# Patient Record
Sex: Female | Born: 1955 | Race: White | Hispanic: No
Health system: Southern US, Community
[De-identification: ages and names within clinical notes are randomized; demographics above are authoritative.]

## PROBLEM LIST (undated history)

## (undated) DIAGNOSIS — R569 Unspecified convulsions: Secondary | ICD-10-CM

## (undated) DIAGNOSIS — K769 Liver disease, unspecified: Secondary | ICD-10-CM

## (undated) DIAGNOSIS — E119 Type 2 diabetes mellitus without complications: Secondary | ICD-10-CM

## (undated) DIAGNOSIS — I10 Essential (primary) hypertension: Secondary | ICD-10-CM

## (undated) DIAGNOSIS — C801 Malignant (primary) neoplasm, unspecified: Secondary | ICD-10-CM

## (undated) HISTORY — PX: MASTECTOMY: SHX3

## (undated) HISTORY — PX: UPPER GASTROINTESTINAL ENDOSCOPY: SHX188

---

## 2011-10-16 DIAGNOSIS — K7581 Nonalcoholic steatohepatitis (NASH): Secondary | ICD-10-CM | POA: Insufficient documentation

## 2012-06-04 DIAGNOSIS — G40209 Localization-related (focal) (partial) symptomatic epilepsy and epileptic syndromes with complex partial seizures, not intractable, without status epilepticus: Secondary | ICD-10-CM | POA: Insufficient documentation

## 2015-06-07 ENCOUNTER — Encounter (HOSPITAL_COMMUNITY): Payer: Self-pay | Admitting: Emergency Medicine

## 2015-06-07 ENCOUNTER — Emergency Department (HOSPITAL_COMMUNITY)
Admission: EM | Admit: 2015-06-07 | Discharge: 2015-06-07 | Disposition: A | Discharge status: Home / Self Care | Payer: Medicare Other | Attending: Physician Assistant | Admitting: Physician Assistant

## 2015-06-07 ENCOUNTER — Emergency Department (HOSPITAL_COMMUNITY): Payer: Medicare Other

## 2015-06-07 DIAGNOSIS — Z87891 Personal history of nicotine dependence: Secondary | ICD-10-CM | POA: Diagnosis not present

## 2015-06-07 DIAGNOSIS — Z85118 Personal history of other malignant neoplasm of bronchus and lung: Secondary | ICD-10-CM | POA: Insufficient documentation

## 2015-06-07 DIAGNOSIS — R110 Nausea: Secondary | ICD-10-CM | POA: Insufficient documentation

## 2015-06-07 DIAGNOSIS — G40909 Epilepsy, unspecified, not intractable, without status epilepticus: Secondary | ICD-10-CM | POA: Diagnosis not present

## 2015-06-07 DIAGNOSIS — N200 Calculus of kidney: Secondary | ICD-10-CM | POA: Diagnosis not present

## 2015-06-07 DIAGNOSIS — Z8719 Personal history of other diseases of the digestive system: Secondary | ICD-10-CM | POA: Insufficient documentation

## 2015-06-07 DIAGNOSIS — R109 Unspecified abdominal pain: Secondary | ICD-10-CM

## 2015-06-07 DIAGNOSIS — Z794 Long term (current) use of insulin: Secondary | ICD-10-CM | POA: Insufficient documentation

## 2015-06-07 DIAGNOSIS — E119 Type 2 diabetes mellitus without complications: Secondary | ICD-10-CM | POA: Diagnosis not present

## 2015-06-07 DIAGNOSIS — Z79899 Other long term (current) drug therapy: Secondary | ICD-10-CM | POA: Insufficient documentation

## 2015-06-07 HISTORY — DX: Malignant (primary) neoplasm, unspecified: C80.1

## 2015-06-07 HISTORY — DX: Unspecified convulsions: R56.9

## 2015-06-07 HISTORY — DX: Liver disease, unspecified: K76.9

## 2015-06-07 HISTORY — DX: Type 2 diabetes mellitus without complications: E11.9

## 2015-06-07 LAB — CBC WITH DIFFERENTIAL/PLATELET
BASOS PCT: 0 % (ref 0–1)
Basophils Absolute: 0 10*3/uL (ref 0.0–0.1)
EOS PCT: 1 % (ref 0–5)
Eosinophils Absolute: 0.1 10*3/uL (ref 0.0–0.7)
HEMATOCRIT: 43.4 % (ref 36.0–46.0)
Hemoglobin: 14.6 g/dL (ref 12.0–15.0)
LYMPHS ABS: 2.1 10*3/uL (ref 0.7–4.0)
Lymphocytes Relative: 27 % (ref 12–46)
MCH: 27.2 pg (ref 26.0–34.0)
MCHC: 33.6 g/dL (ref 30.0–36.0)
MCV: 80.8 fL (ref 78.0–100.0)
Monocytes Absolute: 0.6 10*3/uL (ref 0.1–1.0)
Monocytes Relative: 8 % (ref 3–12)
Neutro Abs: 4.9 10*3/uL (ref 1.7–7.7)
Neutrophils Relative %: 64 % (ref 43–77)
PLATELETS: 177 10*3/uL (ref 150–400)
RBC: 5.37 MIL/uL — ABNORMAL HIGH (ref 3.87–5.11)
RDW: 13.4 % (ref 11.5–15.5)
WBC: 7.7 10*3/uL (ref 4.0–10.5)

## 2015-06-07 LAB — CBG MONITORING, ED: GLUCOSE-CAPILLARY: 201 mg/dL — AB (ref 65–99)

## 2015-06-07 LAB — BASIC METABOLIC PANEL
ANION GAP: 11 (ref 5–15)
BUN: 12 mg/dL (ref 6–20)
CO2: 22 mmol/L (ref 22–32)
CREATININE: 0.88 mg/dL (ref 0.44–1.00)
Calcium: 9.8 mg/dL (ref 8.9–10.3)
Chloride: 103 mmol/L (ref 101–111)
GFR calc Af Amer: >60 mL/min (ref >60)
GFR calc non Af Amer: >60 mL/min (ref >60)
Glucose, Bld: 217 mg/dL — ABNORMAL HIGH (ref 65–99)
Potassium: 4 mmol/L (ref 3.5–5.1)
SODIUM: 136 mmol/L (ref 135–145)

## 2015-06-07 LAB — URINALYSIS, ROUTINE W REFLEX MICROSCOPIC
Bilirubin Urine: NEGATIVE
Glucose, UA: 100 mg/dL — AB
KETONES UR: 15 mg/dL — AB
NITRITE: NEGATIVE
Protein, ur: 30 mg/dL — AB
Specific Gravity, Urine: 1.017 (ref 1.005–1.030)
UROBILINOGEN UA: 1 mg/dL (ref 0.0–1.0)
pH: 8.5 — ABNORMAL HIGH (ref 5.0–8.0)

## 2015-06-07 LAB — URINE MICROSCOPIC-ADD ON

## 2015-06-07 MED ORDER — MORPHINE SULFATE 4 MG/ML IJ SOLN
4.0000 mg | Freq: Once | INTRAMUSCULAR | Status: AC
  Administered 2015-06-07: 4 mg via INTRAVENOUS
  Filled 2015-06-07: qty 1

## 2015-06-07 MED ORDER — SODIUM CHLORIDE 0.9 % IV BOLUS (SEPSIS)
1000.0000 mL | Freq: Once | INTRAVENOUS | Status: AC
  Administered 2015-06-07: 1000 mL via INTRAVENOUS

## 2015-06-07 MED ORDER — OXYCODONE-ACETAMINOPHEN 5-325 MG PO TABS: 1.0000 | ORAL_TABLET | Freq: Four times a day (QID) | ORAL | Status: AC | PRN

## 2015-06-07 MED ORDER — TAMSULOSIN HCL 0.4 MG PO CAPS: 0.4000 mg | ORAL_CAPSULE | Freq: Every day | ORAL | Status: AC

## 2015-06-07 MED ORDER — OXYCODONE-ACETAMINOPHEN 5-325 MG PO TABS
1.0000 | ORAL_TABLET | Freq: Once | ORAL | Status: AC
  Administered 2015-06-07: 1 via ORAL
  Filled 2015-06-07: qty 1

## 2015-06-07 MED ORDER — KETOROLAC TROMETHAMINE 30 MG/ML IJ SOLN
30.0000 mg | Freq: Once | INTRAMUSCULAR | Status: AC
  Administered 2015-06-07: 30 mg via INTRAVENOUS
  Filled 2015-06-07: qty 1

## 2015-06-07 NOTE — ED Notes (Signed)
148/88 BP 78P 100% O2 RA  CBG 237.  Pt C/o R flank pain that began today at 1230. It radiates around to her umbilicus.  Pt was treated with oral antibiotics to help her pass a kidney stone back on 05/19/15 but she developed a rash and her PCP DC'd antibiotics.  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  No meds taken today. Pt has not voided since last night around 930am.

## 2015-06-07 NOTE — ED Notes (Signed)
Pt states that she is in to much pain to go to bathroom and wants a few more minutes for morphine to work.

## 2015-06-07 NOTE — ED Provider Notes (Signed)
CSN: [id number]     Arrival date & time 06/07/15  1414 History   First MD Initiated Contact with Patient 06/07/15 1450     Chief Complaint  Patient presents with  . Flank Pain     (Consider location/radiation/quality/duration/timing/severity/associated sxs/prior Treatment) HPI Comments: Patient is a 59 year old female presenting with right flank pain radiating towards her right groin. Patient diagnosed with kidney stone on July 6 of this month in [place]. Patient given ketorolac with alleviation of symptoms. Patient also treated partially for urinary tract infection. She took 4 days of abx.. Patient has had no symptoms for the last 2-1/2 weeks.Started a this morning at noon having some right flank pain. No fevers. Mild nausea.  Patient is a 59 y.o. female presenting with flank pain.  Flank Pain Pertinent negatives include no chest pain.    Past Medical History  Diagnosis Date  . Cancer   . Diabetes mellitus without complication   . Seizures   . Liver disease    Past Surgical History  Procedure Laterality Date  . Mastectomy     No family history on file. History  Substance Use Topics  . Smoking status: Not on file  . Smokeless tobacco: Never Used  . Alcohol Use: No   OB History    No data available     Review of Systems  Constitutional: Negative for activity change and fatigue.  HENT: Negative for congestion and drooling.   Eyes: Negative for discharge.  Respiratory: Negative for cough and chest tightness.   Cardiovascular: Negative for chest pain.  Gastrointestinal: Positive for nausea. Negative for abdominal distention.  Genitourinary: Positive for flank pain. Negative for dysuria and difficulty urinating.  Musculoskeletal: Negative for joint swelling.  Skin: Negative for rash.  Allergic/Immunologic: Negative for immunocompromised state.  Neurological: Negative for seizures and speech difficulty.  Psychiatric/Behavioral: Negative for behavioral problems and  agitation.      Allergies  Review of patient's allergies indicates no known allergies.  Home Medications   Prior to Admission medications   Medication Sig Start Date End Date Taking? Authorizing Provider  anastrozole (ARIMIDEX) 1 MG tablet Take 1 mg by mouth daily.   Yes Historical Provider, MD  aspirin 81 MG tablet Take 81 mg by mouth daily.   Yes Historical Provider, MD  atorvastatin (LIPITOR) 20 MG tablet Take 20 mg by mouth daily.   Yes Historical Provider, MD  Cholecalciferol (VITAMIN D PO) Take 1 tablet by mouth daily.   Yes Historical Provider, MD  cloNIDine (CATAPRES) 0.1 MG tablet Take 0.1 mg by mouth 2 (two) times daily.   Yes Historical Provider, MD  escitalopram (LEXAPRO) 20 MG tablet Take 20 mg by mouth daily.   Yes Historical Provider, MD  glimepiride (AMARYL) 4 MG tablet Take 4 mg by mouth 2 (two) times daily.   Yes Historical Provider, MD  insulin glargine (LANTUS) 100 UNIT/ML injection Inject 70 Units into the skin at bedtime.   Yes Historical Provider, MD  lamoTRIgine (LAMICTAL) 200 MG tablet Take 200-300 mg by mouth daily. Pt takes 200 mg in the morning and 300 mg in the evening   Yes Historical Provider, MD  lisinopril (PRINIVIL,ZESTRIL) 10 MG tablet Take 10 mg by mouth daily.   Yes Historical Provider, MD  metFORMIN (GLUCOPHAGE) 1000 MG tablet Take 1,000 mg by mouth 2 (two) times daily with a meal.   Yes Historical Provider, MD  Omega-3 Fatty Acids (FISH OIL) 1000 MG CAPS Take 1,000 mg by mouth daily.   Yes  Historical Provider, MD  ranitidine (ZANTAC) 300 MG tablet Take 300 mg by mouth at bedtime.   Yes Historical Provider, MD  Vit B6-Vit B12-Omega 3 Acids (VITAMIN B PLUS+ PO) Take 1 tablet by mouth daily.   Yes Historical Provider, MD  VITAMIN E PO Take 2 tablets by mouth daily.   Yes Historical Provider, MD   BP 174/60 mmHg  Pulse 72  Temp(Src) 98.5 F (36.9 C) (Oral)  Resp 24  Ht 5\' 5"  (1.651 m)  Wt 228 lb (103.42 kg)  BMI 37.94 kg/m2  SpO2 100% Physical  Exam  Constitutional: She is oriented to person, place, and time. She appears well-developed and well-nourished.  HENT:  Head: Normocephalic and atraumatic.  Eyes: Conjunctivae are normal. Right eye exhibits no discharge.  Neck: Neck supple.  Cardiovascular: Normal rate, regular rhythm and normal heart sounds.   No murmur heard. Pulmonary/Chest: Effort normal and breath sounds normal. She has no wheezes. She has no rales.  Abdominal: Soft. She exhibits no distension. There is no tenderness.  Right CVA tenderness.  Musculoskeletal: Normal range of motion. She exhibits no edema.  Neurological: She is oriented to person, place, and time. No cranial nerve deficit.  Skin: Skin is warm and dry. No rash noted. She is not diaphoretic.  Psychiatric: She has a normal mood and affect. Her behavior is normal.  Nursing note and vitals reviewed.   ED Course  Procedures (including critical care time) Labs Review Labs Reviewed  URINALYSIS, ROUTINE W REFLEX MICROSCOPIC (NOT AT [facility])  CBC WITH DIFFERENTIAL/PLATELET  BASIC METABOLIC PANEL    Imaging Review No results found.   EKG Interpretation None      MDM   Final diagnoses:  None    Patient's 58 year old female presenting with right flank pain. History of recent kidney stone. We had patient centered decision-making about CT versus ultrasound to diagnose today's pain. Patient feels strongly about getting a CT. No fevers but will look for urinary tract infection today.  Pt will call urologist for follow up this week.   Sharleen Szczesny Julio Alm, MD 06/07/15 2248

## 2015-06-07 NOTE — ED Notes (Signed)
Patient transported to X-ray 

## 2015-06-07 NOTE — ED Notes (Signed)
Order for Percocet was not a Protocol order, it was a V.O. with read back

## 2015-06-07 NOTE — ED Notes (Signed)
Prior note entered on wrong chart

## 2015-06-07 NOTE — ED Notes (Signed)
Pt states that she will give a urine sample in the next 10 minutes

## 2015-06-07 NOTE — Discharge Instructions (Signed)
You were found to have kidney stone. Please take pain medications as needed. I anticipate that this will get better in 3-4 days. You can make an appointment with follow-up with urology.  You will need to see urology if it does not get better.

## 2015-06-07 NOTE — ED Notes (Signed)
Bed: KJ03 Expected date:  Expected time:  Means of arrival:  Comments: EMS 61 F kidney stones

## 2015-08-19 DIAGNOSIS — G4730 Sleep apnea, unspecified: Secondary | ICD-10-CM | POA: Insufficient documentation

## 2015-08-23 DIAGNOSIS — N200 Calculus of kidney: Secondary | ICD-10-CM | POA: Insufficient documentation

## 2015-09-13 DIAGNOSIS — M818 Other osteoporosis without current pathological fracture: Secondary | ICD-10-CM | POA: Insufficient documentation

## 2016-02-07 DIAGNOSIS — M1990 Unspecified osteoarthritis, unspecified site: Secondary | ICD-10-CM | POA: Insufficient documentation

## 2016-05-21 IMAGING — CT CT ABD-PELV W/O CM
2 of 3 series · 17 of 31 positions shown, 19 images · non-contrast
Comparison: None.

CLINICAL DATA: Right flank pain

EXAM:
CT ABDOMEN AND PELVIS WITHOUT CONTRAST
TECHNIQUE: Multidetector CT imaging of the abdomen and pelvis was performed
following the standard protocol without IV contrast.

[Series 3: coronal · coronal · 0.92mm/px · 3 of 112 slices shown]
[im 38/112  soft-tissue]
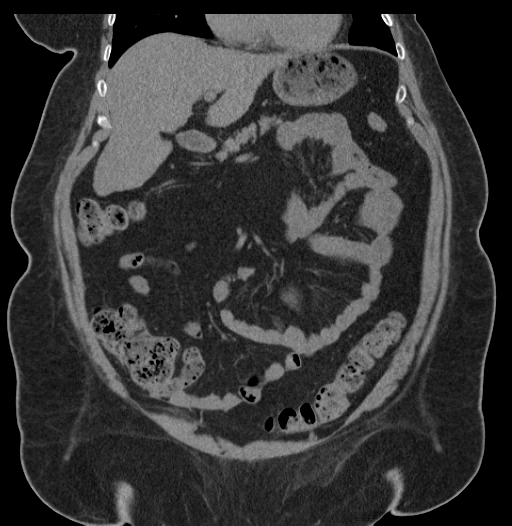
[im 50/112  soft-tissue]
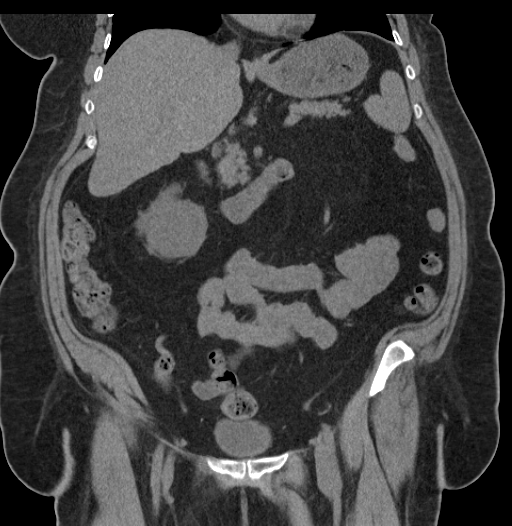
[im 62/112  soft-tissue]
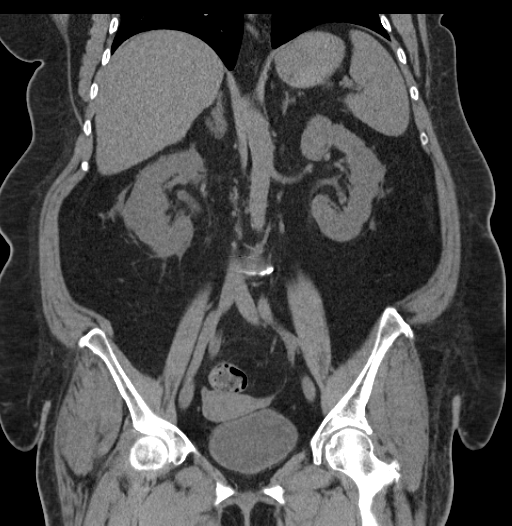

[Series 6: lung · axial · 0.90mm/px · z∈[-39,+31]mm · 14 of 17 slices shown, 16 images]
[im 2/17  soft-tissue]
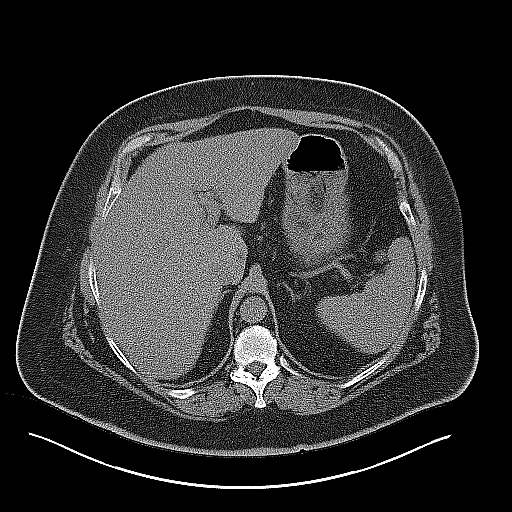
[im 2/17  bone]
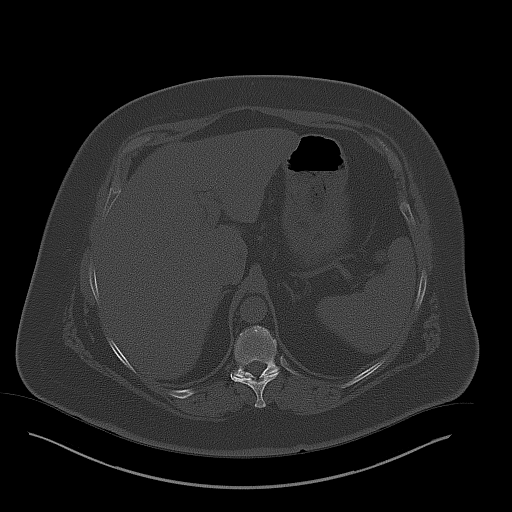
[im 3/17  soft-tissue]
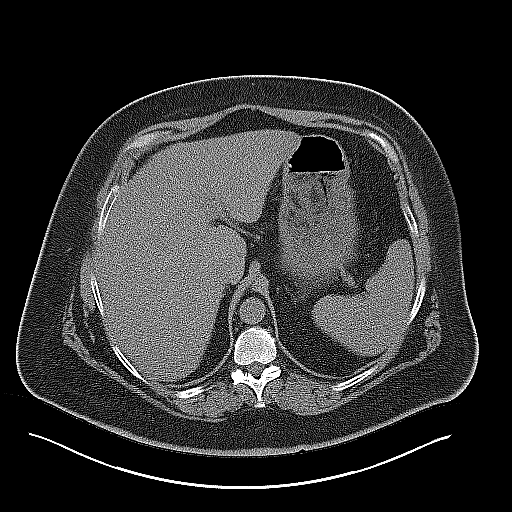
[im 4/17  soft-tissue]
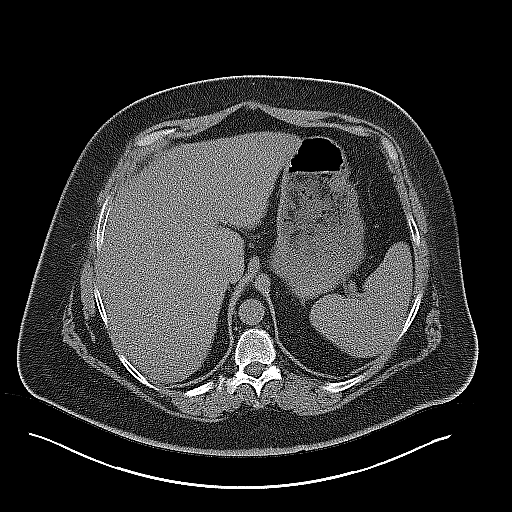
[im 5/17  soft-tissue]
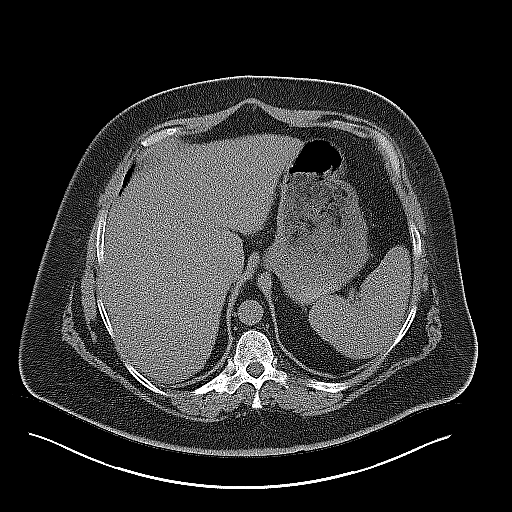
[im 6/17  soft-tissue]
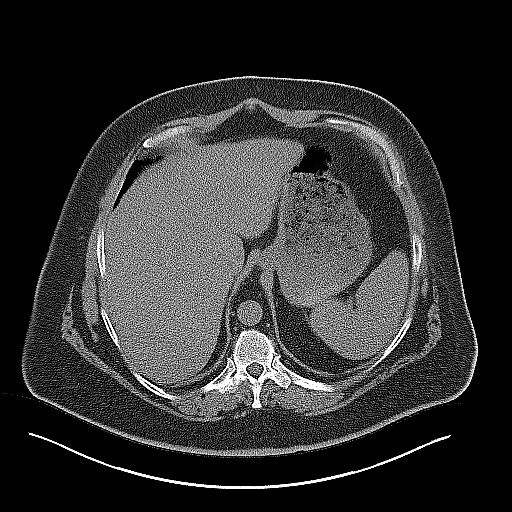
[im 7/17  soft-tissue]
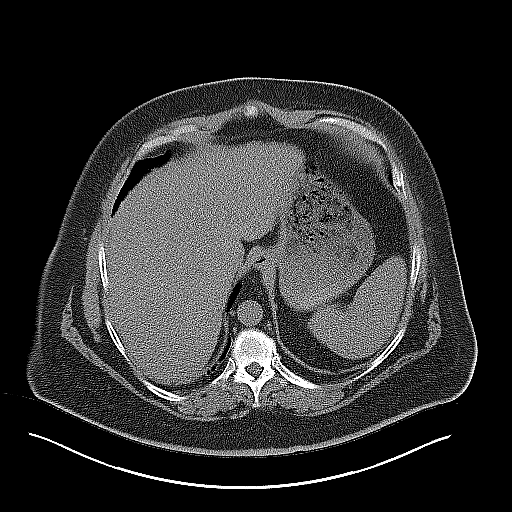
[im 8/17  soft-tissue]
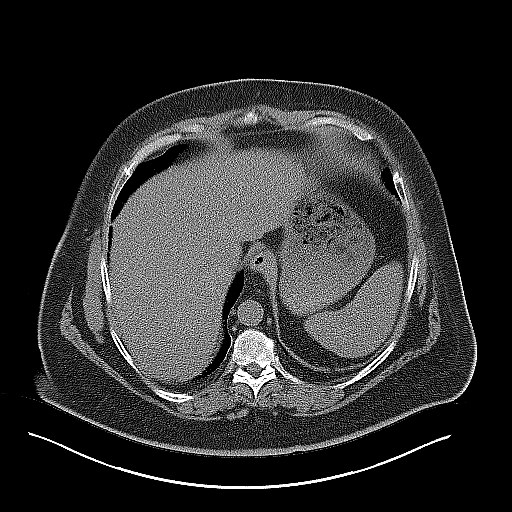
[im 10/17  soft-tissue]
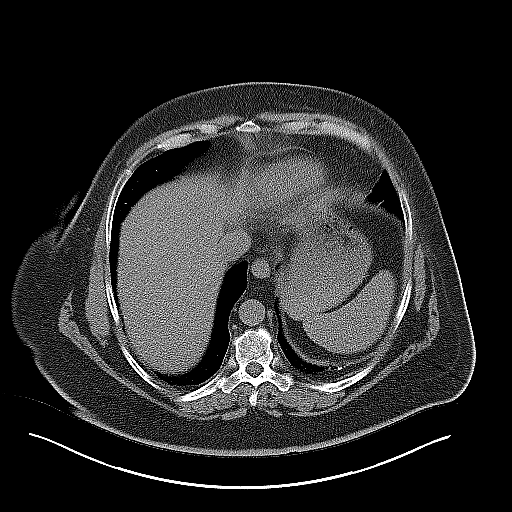
[im 11/17  soft-tissue]
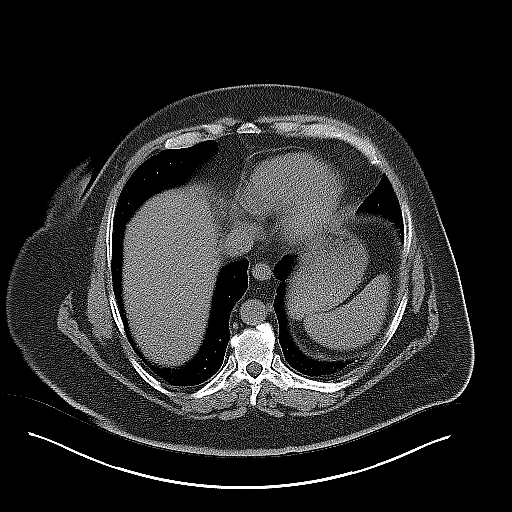
[im 11/17  bone]
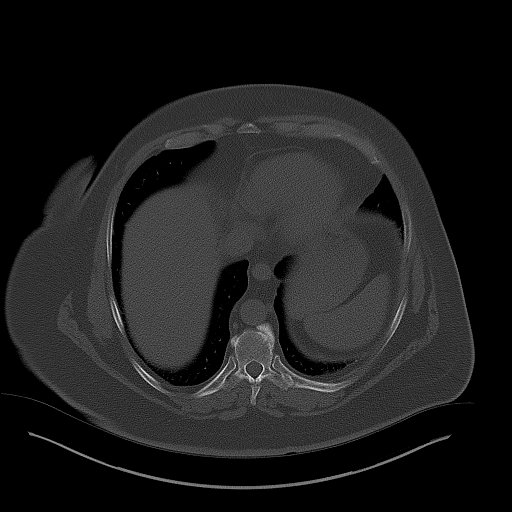
[im 12/17  soft-tissue]
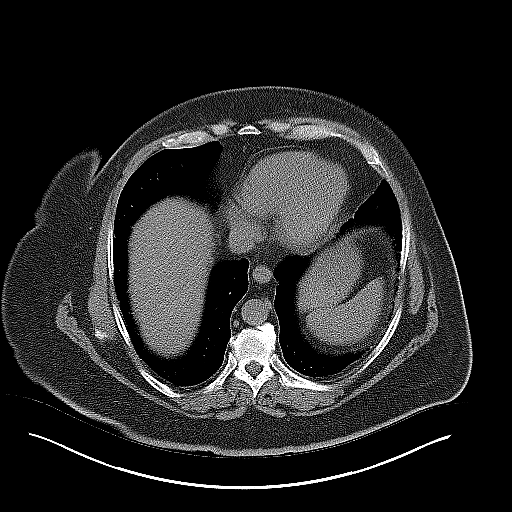
[im 13/17  soft-tissue]
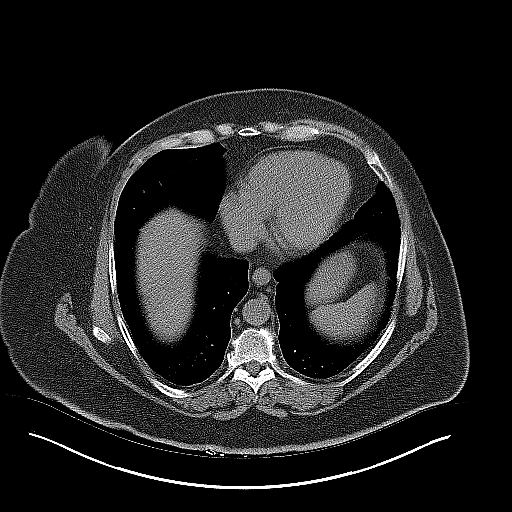
[im 14/17  soft-tissue]
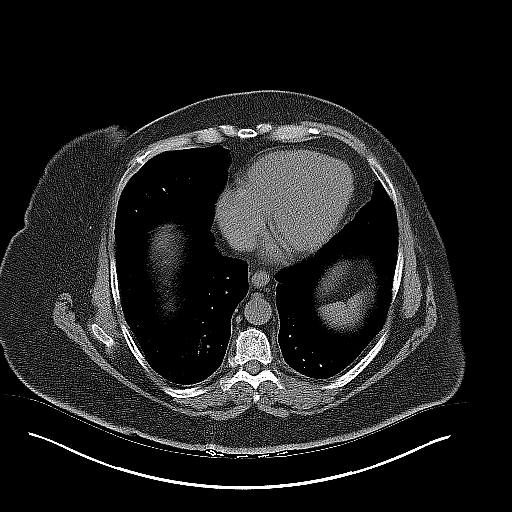
[im 15/17  soft-tissue]
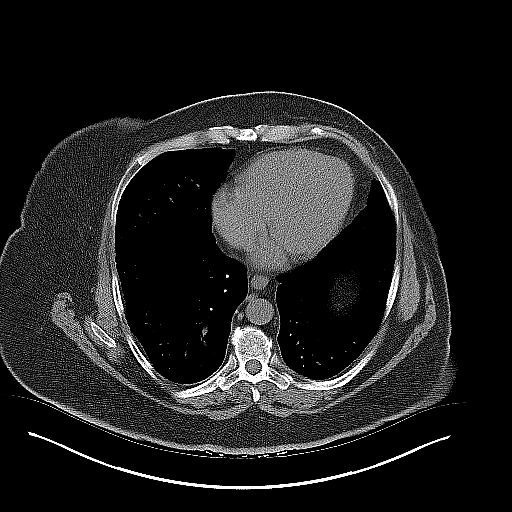
[im 16/17  soft-tissue]
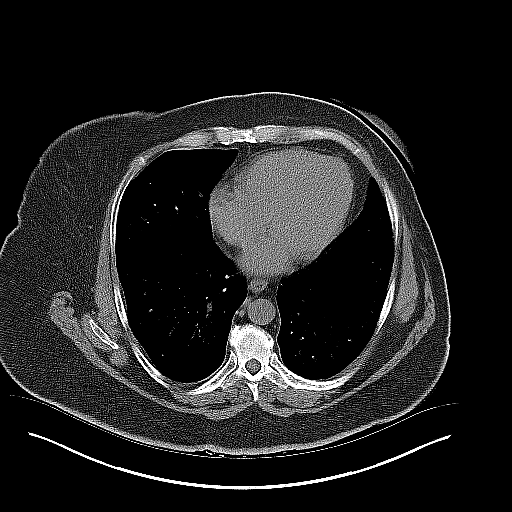

[17 of 31 positions shown; findings below may reference images not displayed]

FINDINGS: Lower chest:  Lung bases clear.

Hepatobiliary: Mild nodularity liver suggesting hepatocellular
disease. This would require further evaluation for confirmation.
Gallbladder surgically absent. No biliary ductal dilatation.

Pancreas: Negative

Spleen: Negative

Adrenals/Urinary Tract: Moderate right hydronephrosis. Perinephric
edema on the right. Right ureter is dilated down to the bladder
where there is a 4 x 6 mm stone at the right UVJ causing
obstruction. Left kidney normal. No other renal calculi or mass.
Adrenal glands normal.

Stomach/Bowel: Negative for bowel obstruction. No bowel edema.
Appendix normal.

Vascular/Lymphatic: Negative

Reproductive: Normal uterus and adnexa.

Other: Negative for ascites.  No mass or adenopathy.

Musculoskeletal: Disc degeneration and spurring L5-S1.
IMPRESSION: 4 x 6 mm stone right UVJ causing obstruction of the right kidney. No
other renal calculi

Mild nodular contour of the liver suggesting hepatocellular disease
such as cirrhosis. Further evaluation with CT or MRI of the liver
with contrast would be necessary to confirm the finding.

## 2019-05-12 DIAGNOSIS — Z853 Personal history of malignant neoplasm of breast: Secondary | ICD-10-CM | POA: Insufficient documentation

## 2020-07-12 DIAGNOSIS — D320 Benign neoplasm of cerebral meninges: Secondary | ICD-10-CM | POA: Insufficient documentation

## 2020-12-15 DIAGNOSIS — K7460 Unspecified cirrhosis of liver: Secondary | ICD-10-CM | POA: Insufficient documentation

## 2020-12-15 DIAGNOSIS — I83891 Varicose veins of right lower extremities with other complications: Secondary | ICD-10-CM | POA: Insufficient documentation

## 2021-01-03 LAB — EXTERNAL GENERIC LAB PROCEDURE: COLOGUARD: NEGATIVE

## 2021-01-03 LAB — COLOGUARD: COLOGUARD: NEGATIVE

## 2021-05-11 ENCOUNTER — Other Ambulatory Visit: Payer: Self-pay

## 2021-05-11 ENCOUNTER — Emergency Department (HOSPITAL_COMMUNITY)
Admission: EM | Admit: 2021-05-11 | Discharge: 2021-05-11 | Disposition: A | Discharge status: Home / Self Care | Payer: Medicare Other | Attending: Emergency Medicine | Admitting: Emergency Medicine

## 2021-05-11 ENCOUNTER — Emergency Department (HOSPITAL_COMMUNITY): Payer: Medicare Other

## 2021-05-11 ENCOUNTER — Encounter (HOSPITAL_COMMUNITY): Payer: Self-pay

## 2021-05-11 DIAGNOSIS — R072 Precordial pain: Secondary | ICD-10-CM | POA: Insufficient documentation

## 2021-05-11 DIAGNOSIS — Z7982 Long term (current) use of aspirin: Secondary | ICD-10-CM | POA: Insufficient documentation

## 2021-05-11 DIAGNOSIS — E119 Type 2 diabetes mellitus without complications: Secondary | ICD-10-CM | POA: Diagnosis not present

## 2021-05-11 DIAGNOSIS — Z7984 Long term (current) use of oral hypoglycemic drugs: Secondary | ICD-10-CM | POA: Insufficient documentation

## 2021-05-11 DIAGNOSIS — Z79899 Other long term (current) drug therapy: Secondary | ICD-10-CM | POA: Diagnosis not present

## 2021-05-11 DIAGNOSIS — I10 Essential (primary) hypertension: Secondary | ICD-10-CM | POA: Diagnosis not present

## 2021-05-11 DIAGNOSIS — Z859 Personal history of malignant neoplasm, unspecified: Secondary | ICD-10-CM | POA: Diagnosis not present

## 2021-05-11 HISTORY — DX: Essential (primary) hypertension: I10

## 2021-05-11 LAB — COMPREHENSIVE METABOLIC PANEL
ALT: 28 U/L (ref 0–44)
AST: 30 U/L (ref 15–41)
Albumin: 3.4 g/dL — ABNORMAL LOW (ref 3.5–5.0)
Alkaline Phosphatase: 98 U/L (ref 38–126)
Anion gap: 8 (ref 5–15)
BUN: 8 mg/dL (ref 8–23)
CO2: 25 mmol/L (ref 22–32)
Calcium: 9.8 mg/dL (ref 8.9–10.3)
Chloride: 103 mmol/L (ref 98–111)
Creatinine, Ser: 0.64 mg/dL (ref 0.44–1.00)
GFR, Estimated: >60 mL/min (ref >60)
Glucose, Bld: 89 mg/dL (ref 70–99)
Potassium: 3.8 mmol/L (ref 3.5–5.1)
Sodium: 136 mmol/L (ref 135–145)
Total Bilirubin: 0.7 mg/dL (ref 0.3–1.2)
Total Protein: 7.2 g/dL (ref 6.5–8.1)

## 2021-05-11 LAB — CBC WITH DIFFERENTIAL/PLATELET
Abs Immature Granulocytes: 0.02 10*3/uL (ref 0.00–0.07)
Basophils Absolute: 0 10*3/uL (ref 0.0–0.1)
Basophils Relative: 0 %
Eosinophils Absolute: 0 10*3/uL (ref 0.0–0.5)
Eosinophils Relative: 1 %
HCT: 37.7 % (ref 36.0–46.0)
Hemoglobin: 12.4 g/dL (ref 12.0–15.0)
Immature Granulocytes: 0 %
Lymphocytes Relative: 31 %
Lymphs Abs: 1.8 10*3/uL (ref 0.7–4.0)
MCH: 26.9 pg (ref 26.0–34.0)
MCHC: 32.9 g/dL (ref 30.0–36.0)
MCV: 81.8 fL (ref 80.0–100.0)
Monocytes Absolute: 0.5 10*3/uL (ref 0.1–1.0)
Monocytes Relative: 8 %
Neutro Abs: 3.5 10*3/uL (ref 1.7–7.7)
Neutrophils Relative %: 60 %
Platelets: 119 10*3/uL — ABNORMAL LOW (ref 150–400)
RBC: 4.61 MIL/uL (ref 3.87–5.11)
RDW: 15.4 % (ref 11.5–15.5)
WBC: 5.9 10*3/uL (ref 4.0–10.5)
nRBC: 0 % (ref 0.0–0.2)

## 2021-05-11 LAB — TROPONIN I (HIGH SENSITIVITY): Troponin I (High Sensitivity): 4 ng/L (ref <18)

## 2021-05-11 LAB — LIPASE, BLOOD: Lipase: 32 U/L (ref 11–51)

## 2021-05-11 NOTE — ED Triage Notes (Signed)
Pt arrives via [employer]. EMS for CP. Pt reports sudden onset of L sided CP radiating to her back. Pt has hx HTN, HLD, DM. 324 mg ASA given by EMS.   #20 R FA by EMS   EMS last VS - CBG 93, 122/76, HR 80, 97% on RA, 98.4 temp.

## 2021-05-11 NOTE — ED Notes (Signed)
Patient verbalizes understanding of discharge instructions. Opportunity for questioning and answers were provided. Armband removed by staff, pt discharged from ED ambulatory.   

## 2021-05-11 NOTE — Discharge Instructions (Signed)
Please read and follow all provided instructions.  Your diagnoses today include:  1. Precordial pain     Tests performed today include: An EKG of your heart A chest x-ray Cardiac enzymes - a blood test for heart muscle damage.  The first test was normal.  We would typically like to check a second test to ensure that this level is not going up.  Blood counts and electrolytes Vital signs. See below for your results today.   Medications prescribed:  None  Take any prescribed medications only as directed.  Follow-up instructions: Please follow-up with your primary care provider as soon as you can for further evaluation of your symptoms.   Return instructions:  SEEK IMMEDIATE MEDICAL ATTENTION IF: You have severe chest pain, especially if the pain is crushing or pressure-like and spreads to the arms, back, neck, or jaw, or if you have sweating, nausea (feeling sick to your stomach), or shortness of breath. THIS IS AN EMERGENCY. Don't wait to see if the pain will go away. Get medical help at once. Call 911 or 0 (operator). DO NOT drive yourself to the hospital.  Your chest pain gets worse and does not go away with rest.  You have an attack of chest pain lasting longer than usual, despite rest and treatment with the medications your caregiver has prescribed.  You wake from sleep with chest pain or shortness of breath. You feel dizzy or faint. You have chest pain not typical of your usual pain for which you originally saw your caregiver.  You have any other emergent concerns regarding your health.  Additional Information: Chest pain comes from many different causes. Your caregiver has diagnosed you as having chest pain that is not specific for one problem, but does not require admission.  You are at low risk for an acute heart condition or other serious illness.   Your vital signs today were: BP 112/74   Pulse 80   Temp 98.4 F (36.9 C) (Oral)   Resp 11   Ht 5\' 5"  (1.651 m)   Wt  102.1 kg   SpO2 100%   BMI 37.44 kg/m  If your blood pressure (BP) was elevated above 135/85 this visit, please have this repeated by your doctor within one month. --------------

## 2021-05-11 NOTE — ED Notes (Signed)
Pt ambulatory to restroom

## 2021-05-11 NOTE — ED Notes (Signed)
Pt refusing second troponin. MD and PA notified.

## 2021-05-11 NOTE — ED Provider Notes (Signed)
[facility] EMERGENCY DEPARTMENT Provider Note   CSN: [id number] Arrival date & time: 05/11/21  1721     History Chief Complaint  Patient presents with   Chest Pain    Natasha Bond is a 65 y.o. female.  Patient with history of diabetes, cirrhosis of the liver, hypertension, high cholesterol --presents to the emergency department for evaluation of chest pain.  Pain started about 1 hour prior to arrival.  She describes a heavy kind of pain in her mid chest with radiation to her middle back.  No associated vomiting, diaphoresis, shortness of breath.  Pain did not moved to the arm or to the jaw.  No associated abdominal pain.  She does not report any weakness or other stroke type symptoms.  EMS was called due to severity of pain.  They gave aspirin.  Symptoms have improved but not resolved.  She had a liver biopsy performed 2 days ago.  The onset of this condition was acute. The course is constant. Aggravating factors: none. Alleviating factors: none.        Past Medical History:  Diagnosis Date   Cancer ([place])    Diabetes mellitus without complication ([place])    Hypertension    Liver disease    Seizures ([place])     There are no problems to display for this patient.   Past Surgical History:  Procedure Laterality Date   MASTECTOMY     UPPER GASTROINTESTINAL ENDOSCOPY       OB History   No obstetric history on file.     No family history on file.  Social History   Tobacco Use   Smokeless tobacco: Never  Substance Use Topics   Alcohol use: No   Drug use: No    Home Medications Prior to Admission medications   Medication Sig Start Date End Date Taking? Authorizing Provider  anastrozole (ARIMIDEX) 1 MG tablet Take 1 mg by mouth daily.    [provider]  aspirin 81 MG tablet Take 81 mg by mouth daily.    [provider]  atorvastatin (LIPITOR) 20 MG tablet Take 20 mg by mouth daily.    [provider]  Cholecalciferol  (VITAMIN D PO) Take 1 tablet by mouth daily.    [provider]  cloNIDine (CATAPRES) 0.1 MG tablet Take 0.1 mg by mouth 2 (two) times daily.    [provider]  escitalopram (LEXAPRO) 20 MG tablet Take 20 mg by mouth daily.    [provider]  glimepiride (AMARYL) 4 MG tablet Take 4 mg by mouth 2 (two) times daily.    [provider]  insulin glargine (LANTUS) 100 UNIT/ML injection Inject 70 Units into the skin at bedtime.    [provider]  lamoTRIgine (LAMICTAL) 200 MG tablet Take 200-300 mg by mouth daily. Pt takes 200 mg in the morning and 300 mg in the evening    [provider]  lisinopril (PRINIVIL,ZESTRIL) 10 MG tablet Take 10 mg by mouth daily.    [provider]  metFORMIN (GLUCOPHAGE) 1000 MG tablet Take 1,000 mg by mouth 2 (two) times daily with a meal.    [provider]  Omega-3 Fatty Acids (FISH OIL) 1000 MG CAPS Take 1,000 mg by mouth daily.    [provider]  oxyCODONE-acetaminophen (PERCOCET/ROXICET) 5-325 MG per tablet Take 1 tablet by mouth every 6 (six) hours as needed for severe pain. 06/07/15   Mackuen, Courteney Lyn, MD  ranitidine (ZANTAC) 300 MG tablet  Take 300 mg by mouth at bedtime.    [provider]  tamsulosin (FLOMAX) 0.4 MG CAPS capsule Take 1 capsule (0.4 mg total) by mouth daily. 06/07/15   Mackuen, Courteney Lyn, MD  Vit B6-Vit B12-Omega 3 Acids (VITAMIN B PLUS+ PO) Take 1 tablet by mouth daily.    [provider]  VITAMIN E PO Take 2 tablets by mouth daily.    [provider]    Allergies    Tape  Review of Systems   Review of Systems  Constitutional:  Negative for fever.  HENT:  Negative for rhinorrhea and sore throat.   Eyes:  Negative for redness.  Respiratory:  Negative for cough.   Cardiovascular:  Positive for chest pain.  Gastrointestinal:  Negative for abdominal pain, diarrhea, nausea and vomiting.  Genitourinary:  Negative for dysuria,  frequency, hematuria and urgency.  Musculoskeletal:  Positive for back pain. Negative for myalgias.  Skin:  Negative for rash.  Neurological:  Negative for headaches.   Physical Exam Updated Vital Signs BP 140/67   Pulse 86   Temp 98.4 F (36.9 C) (Oral)   Resp 15   Ht 5\' 5"  (1.651 m)   Wt 102.1 kg   SpO2 97%   BMI 37.44 kg/m   Physical Exam Vitals and nursing note reviewed.  Constitutional:      General: She is not in acute distress.    Appearance: She is well-developed.  HENT:     Head: Normocephalic and atraumatic.     Right Ear: External ear normal.     Left Ear: External ear normal.     Nose: Nose normal.  Eyes:     Conjunctiva/sclera: Conjunctivae normal.  Cardiovascular:     Rate and Rhythm: Normal rate and regular rhythm.     Heart sounds: No murmur heard. Pulmonary:     Effort: No respiratory distress.     Breath sounds: No wheezing, rhonchi or rales.  Abdominal:     Palpations: Abdomen is soft.     Tenderness: There is no abdominal tenderness. There is no guarding or rebound.  Musculoskeletal:     Cervical back: Normal range of motion and neck supple.     Right lower leg: No edema.     Left lower leg: No edema.  Skin:    General: Skin is warm and dry.     Findings: No rash.  Neurological:     General: No focal deficit present.     Mental Status: She is alert. Mental status is at baseline.     Motor: No weakness.  Psychiatric:        Mood and Affect: Mood normal.    ED Results / Procedures / Treatments   Labs (all labs ordered are listed, but only abnormal results are displayed) Labs Reviewed  CBC WITH DIFFERENTIAL/PLATELET - Abnormal; Notable for the following components:      Result Value   Platelets 119 (*)    All other components within normal limits  COMPREHENSIVE METABOLIC PANEL - Abnormal; Notable for the following components:   Albumin 3.4 (*)    All other components within normal limits  LIPASE, BLOOD  TROPONIN I (HIGH SENSITIVITY)   TROPONIN I (HIGH SENSITIVITY)    EKG EKG Interpretation  Date/Time:  Wednesday May 11 2021 17:31:00 EDT Ventricular Rate:  82 PR Interval:  166 QRS Duration: 94 QT Interval:  380 QTC Calculation: 444 R Axis:   14 Text Interpretation: Sinus rhythm Left ventricular hypertrophy Confirmed by  Lennice Sites [phone]) on 05/11/2021 5:32:31 PM  Radiology No results found.  Procedures Procedures   Medications Ordered in ED Medications - No data to display  ED Course  I have reviewed the triage vital signs and the nursing notes.  Pertinent labs & imaging results that were available during my care of the patient were reviewed by me and considered in my medical decision making (see chart for details).  Patient seen and examined. Work-up initiated. EKG reviewed.   Vital signs reviewed and are as follows: BP 140/67   Pulse 86   Temp 98.4 F (36.9 C) (Oral)   Resp 15   Ht 5\' 5"  (1.651 m)   Wt 102.1 kg   SpO2 97%   BMI 37.44 kg/m   Informed by RN that patient is declining second troponin draw.  Work-up to this point is overall reassuring.  I went and spoke with the patient.  She states that her symptoms have completely resolved and she would like to go home.  I discussed that typically we will check a second troponin to ensure that this value is not going up, which would suggest stress on the heart or heart attack.  She states that she understands.  She states that it has been a very stressful week for her and she would like to go home.  She states that she is willing to follow-up with her doctor and return if her symptoms get worse.  Discussed that if she develops chest pain or any shortness of breath, she should return immediately to the emergency department.  She verbalizes understanding agrees with plan.  Family member at bedside during discussion.     MDM Rules/Calculators/A&P                          Patient presents by EMS with chest pain.  She has been under some stress  recently and has several risk factors.  Initial work-up here with EKG, chest x-ray, troponin x1, lab work is reassuring.  Patient symptoms were improving on arrival and resolved at time of discharge.  Patient refused second troponin.  She wants to go home.  Final Clinical Impression(s) / ED Diagnoses Final diagnoses:  Precordial pain    Rx / DC Orders ED Discharge Orders     None        Carlisle Cater, PA-C 05/11/21 2017    Lennice Sites, DO 05/11/21 2317

## 2021-12-06 DIAGNOSIS — I10 Essential (primary) hypertension: Secondary | ICD-10-CM | POA: Insufficient documentation

## 2022-04-25 IMAGING — DX DG CHEST 1V PORT
1 series · 1 of 1 positions shown · non-contrast
Comparison: None.

CLINICAL DATA: 64-year-old female with chest pain.

EXAM:
PORTABLE CHEST 1 VIEW

[chest]
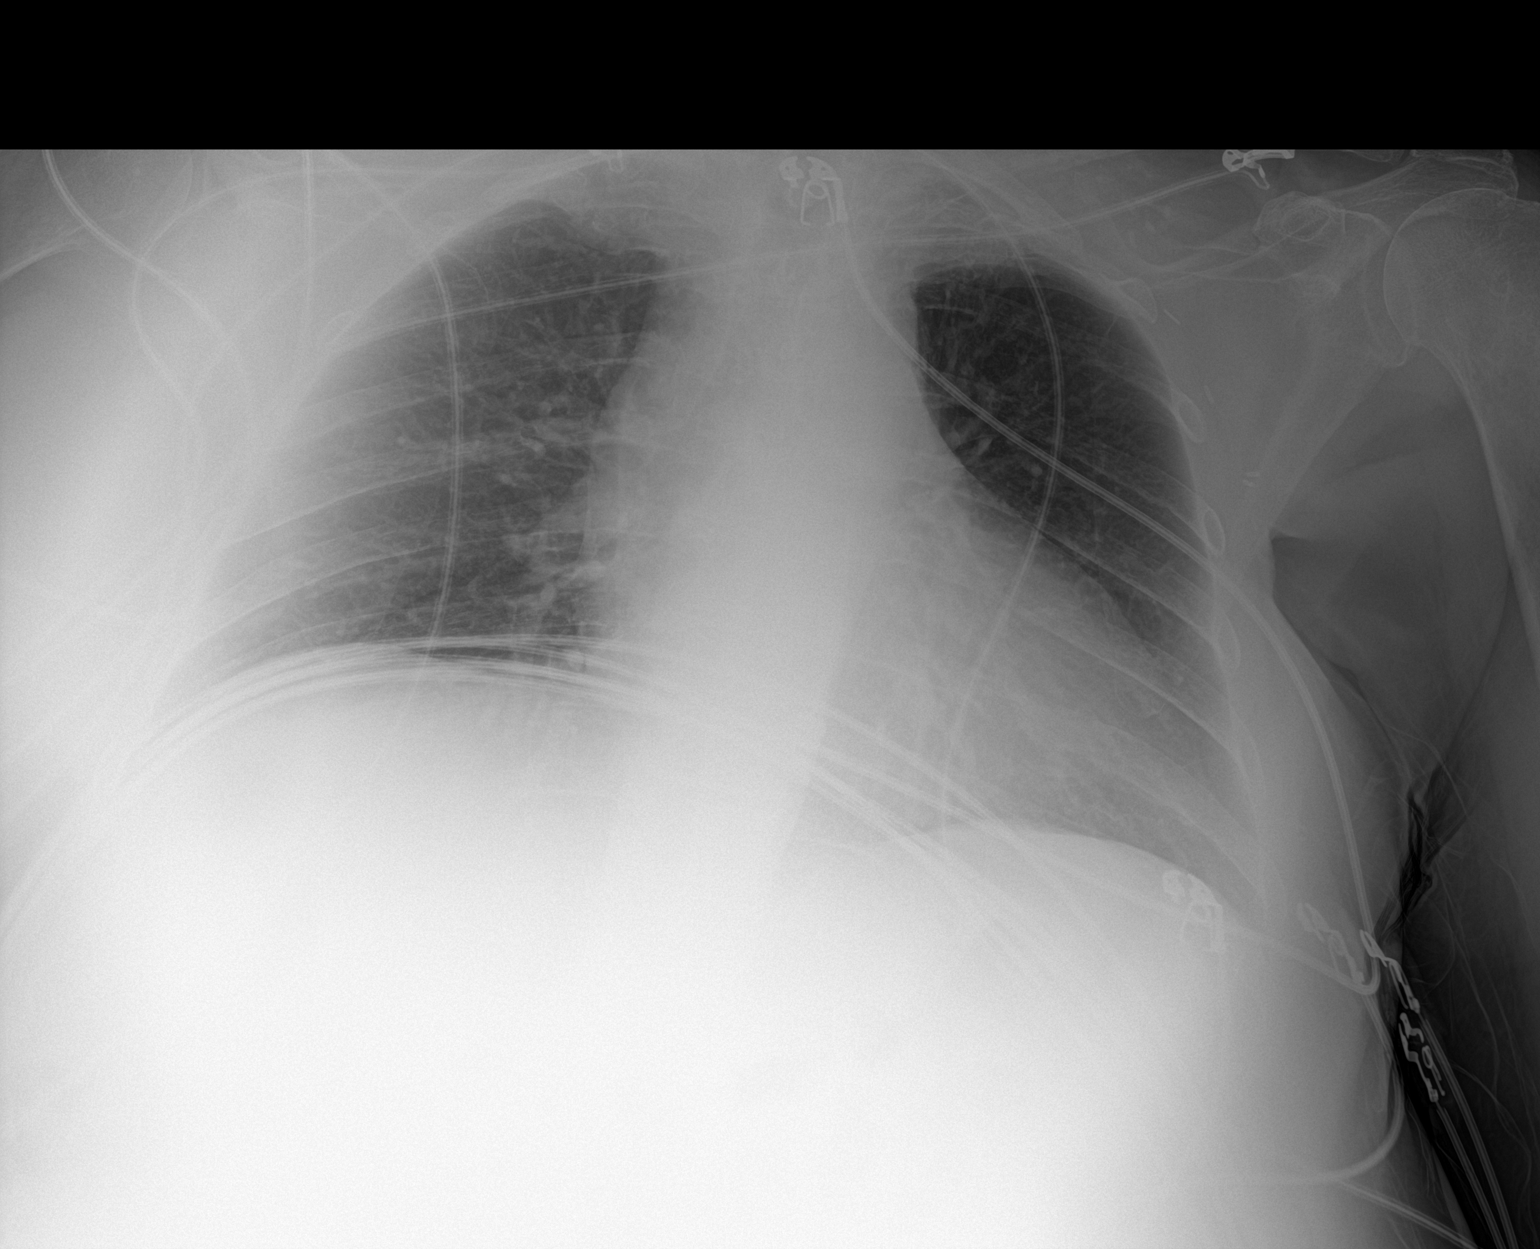

[1 of 1 positions shown; findings below may reference images not displayed]

FINDINGS: Shallow inspiration. No focal consolidation, pleural effusion,
pneumothorax. Top-normal cardiac size. No acute osseous pathology.
IMPRESSION: No active disease.

## 2022-12-18 DIAGNOSIS — E1165 Type 2 diabetes mellitus with hyperglycemia: Secondary | ICD-10-CM | POA: Insufficient documentation

## 2022-12-18 DIAGNOSIS — D696 Thrombocytopenia, unspecified: Secondary | ICD-10-CM | POA: Insufficient documentation

## 2023-06-25 DIAGNOSIS — E1169 Type 2 diabetes mellitus with other specified complication: Secondary | ICD-10-CM | POA: Insufficient documentation

## 2023-07-04 DIAGNOSIS — H353190 Nonexudative age-related macular degeneration, unspecified eye, stage unspecified: Secondary | ICD-10-CM | POA: Insufficient documentation

## 2023-11-15 ENCOUNTER — Ambulatory Visit (INDEPENDENT_AMBULATORY_CARE_PROVIDER_SITE_OTHER): Payer: Medicare Other | Admitting: Podiatry

## 2023-11-15 DIAGNOSIS — M79675 Pain in left toe(s): Secondary | ICD-10-CM

## 2023-11-15 DIAGNOSIS — L600 Ingrowing nail: Secondary | ICD-10-CM

## 2023-11-15 DIAGNOSIS — L84 Corns and callosities: Secondary | ICD-10-CM | POA: Diagnosis not present

## 2023-11-15 DIAGNOSIS — B351 Tinea unguium: Secondary | ICD-10-CM

## 2023-11-15 DIAGNOSIS — E1151 Type 2 diabetes mellitus with diabetic peripheral angiopathy without gangrene: Secondary | ICD-10-CM | POA: Diagnosis not present

## 2023-11-15 DIAGNOSIS — M79674 Pain in right toe(s): Secondary | ICD-10-CM

## 2023-11-15 MED ORDER — MUPIROCIN 2 % EX OINT: TOPICAL_OINTMENT | CUTANEOUS | 1 refills | Status: AC

## 2023-11-15 NOTE — Progress Notes (Signed)
 Subjective:  Patient ID: Natasha Bond, female    DOB: 09/14/1956,  MRN: [id number]  Natasha Bond presents to clinic today for:  Chief Complaint  Patient presents with   [facility]    [facility], NP , Left great to was infected and tested Positive for MRSA. Looks dry andskin is peeling.  A1c 10.3 ASA 81   Patient notes nails are thick and elongated, causing pain in shoe gear when ambulating.  She had a recent MRSA injection from some torn skin along the lateral aspect of the left hallux.  She responded well to doxycycline.  She did confirm she has cirrhosis of the liver but had no adverse effects from the doxycycline.  As she is unsure whether she is starting to have an ingrown nail on the right second toe along the medial border.  She states her father recently passed away during the holiday season.  Her blood sugars are uncontrolled with an elevated A1c level around 10.  PCP is Natasha Bond, Natasha HERO, Natasha Bond. last seen around 09/27/2023  Past Medical History:  Diagnosis Date   Cancer (HCC)    Diabetes mellitus without complication (HCC)    Hypertension    Liver disease    Seizures (HCC)     Allergies  Allergen Reactions   Paclitaxel Other (See Comments)    Other Reaction: Seizures  Other Reaction: Seizures  Other Reaction: Seizures   Silver Dermatitis    Other Reaction: blisters  Other Reaction: blisters  Other Reaction: blisters   Cephalexin Nausea And Vomiting   Dulaglutide Nausea Only   Tape Dermatitis    Objective:  Natasha Bond is a pleasant 68 y.o. female in NAD. AAO x 3.  Vascular Examination: Patient has palpable DP pulse, absent PT pulse bilateral.  Delayed capillary refill bilateral toes.  Sparse digital hair bilateral.  Proximal to distal cooling WNL bilateral.    Dermatological Examination: Interspaces are clear with no open lesions noted bilateral.  Skin is shiny and atrophic bilateral.  Nails are 3-71mm thick, with yellowish/brown discoloration, subungual  debris and distal onycholysis x10.  There is pain with compression of nails x10.  There are hyperkeratotic lesions noted dorsal lateral aspect of the fifth toe at the PIP joint bilateral.  There is minimal erythema noted on the medial aspect of the right second toe along the medial nail margin.  Minimal incurvation of the medial nail border of the right second toe is noted.  No drainage is noted.  There is peeling skin located along the lateral aspect of the left hallux lateral nail margin from the recent infection which appears to be resolved at this time.  No surrounding erythema is seen.  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  Assessment/Plan: 1. Pain due to onychomycosis of toenails of both feet   2. Corns   3. Type II diabetes mellitus with peripheral circulatory disorder (HCC)     Meds ordered this encounter  Medications   mupirocin  ointment (BACTROBAN ) 2 %    Sig: Apply to affected toes at bedtime until skin heals    Dispense:  22 g    Refill:  1    Mycotic nails x10 were sharply debrided with sterile nail nippers and power debriding burr to decrease bulk and length.  Hyperkeratotic lesions x2 were shaved with #312 blade.  Prescription sent in for Bactroban  ointment.  I had planned on sending in Silvadene cream but there is an allergy to silver on her file.  She was instructed to apply the Bactroban  ointment to the left hallux lateral nail border as well as the right second toe medial nail border once daily until the skin appears resolved.  Due to her history of MRSA recommend that she use this ointment over Neosporin for any cuts or scrapes that she develops to her skin.   Return in about 3 months (around 02/13/2024) for [facility].   Natasha Bond, DPM, FACFAS [facility]     [address] [place], [place] [address]                Office [phone]  Fax [phone]

## 2023-11-18 DIAGNOSIS — G40909 Epilepsy, unspecified, not intractable, without status epilepticus: Secondary | ICD-10-CM | POA: Insufficient documentation

## 2023-11-18 DIAGNOSIS — Z87898 Personal history of other specified conditions: Secondary | ICD-10-CM | POA: Insufficient documentation

## 2023-11-18 DIAGNOSIS — F32A Depression, unspecified: Secondary | ICD-10-CM | POA: Insufficient documentation

## 2023-11-18 DIAGNOSIS — E119 Type 2 diabetes mellitus without complications: Secondary | ICD-10-CM | POA: Insufficient documentation

## 2024-02-21 ENCOUNTER — Emergency Department (HOSPITAL_COMMUNITY)

## 2024-02-21 ENCOUNTER — Other Ambulatory Visit: Payer: Self-pay

## 2024-02-21 ENCOUNTER — Emergency Department (HOSPITAL_COMMUNITY)
Admission: EM | Admit: 2024-02-21 | Discharge: 2024-02-22 | Disposition: A | Discharge status: Home / Self Care | Attending: Emergency Medicine | Admitting: Emergency Medicine

## 2024-02-21 DIAGNOSIS — I10 Essential (primary) hypertension: Secondary | ICD-10-CM | POA: Diagnosis not present

## 2024-02-21 DIAGNOSIS — Z7982 Long term (current) use of aspirin: Secondary | ICD-10-CM | POA: Diagnosis not present

## 2024-02-21 DIAGNOSIS — M545 Low back pain, unspecified: Secondary | ICD-10-CM

## 2024-02-21 DIAGNOSIS — R109 Unspecified abdominal pain: Secondary | ICD-10-CM | POA: Diagnosis present

## 2024-02-21 DIAGNOSIS — Z794 Long term (current) use of insulin: Secondary | ICD-10-CM | POA: Diagnosis not present

## 2024-02-21 DIAGNOSIS — Z853 Personal history of malignant neoplasm of breast: Secondary | ICD-10-CM | POA: Insufficient documentation

## 2024-02-21 DIAGNOSIS — E119 Type 2 diabetes mellitus without complications: Secondary | ICD-10-CM | POA: Diagnosis not present

## 2024-02-21 DIAGNOSIS — Z79899 Other long term (current) drug therapy: Secondary | ICD-10-CM | POA: Insufficient documentation

## 2024-02-21 LAB — COMPREHENSIVE METABOLIC PANEL WITH GFR
ALT: 24 U/L (ref 0–44)
AST: 31 U/L (ref 15–41)
Albumin: 3.3 g/dL — ABNORMAL LOW (ref 3.5–5.0)
Alkaline Phosphatase: 93 U/L (ref 38–126)
Anion gap: 7 (ref 5–15)
BUN: 10 mg/dL (ref 8–23)
CO2: 25 mmol/L (ref 22–32)
Calcium: 9.2 mg/dL (ref 8.9–10.3)
Chloride: 104 mmol/L (ref 98–111)
Creatinine, Ser: 0.54 mg/dL (ref 0.44–1.00)
GFR, Estimated: >60 mL/min (ref >60)
Glucose, Bld: 208 mg/dL — ABNORMAL HIGH (ref 70–99)
Potassium: 4.3 mmol/L (ref 3.5–5.1)
Sodium: 136 mmol/L (ref 135–145)
Total Bilirubin: 0.7 mg/dL (ref 0.0–1.2)
Total Protein: 7.2 g/dL (ref 6.5–8.1)

## 2024-02-21 LAB — URINALYSIS, ROUTINE W REFLEX MICROSCOPIC
Bacteria, UA: NONE SEEN
Glucose, UA: 50 mg/dL — AB
Hgb urine dipstick: NEGATIVE
Ketones, ur: NEGATIVE mg/dL
Leukocytes,Ua: NEGATIVE
Nitrite: NEGATIVE
Protein, ur: 100 mg/dL — AB
Specific Gravity, Urine: 1.041 — ABNORMAL HIGH (ref 1.005–1.030)
pH: 5 (ref 5.0–8.0)

## 2024-02-21 LAB — CBC
HCT: 37.8 % (ref 36.0–46.0)
Hemoglobin: 12.2 g/dL (ref 12.0–15.0)
MCH: 27.7 pg (ref 26.0–34.0)
MCHC: 32.3 g/dL (ref 30.0–36.0)
MCV: 85.9 fL (ref 80.0–100.0)
Platelets: 103 10*3/uL — ABNORMAL LOW (ref 150–400)
RBC: 4.4 MIL/uL (ref 3.87–5.11)
RDW: 13.9 % (ref 11.5–15.5)
WBC: 5.1 10*3/uL (ref 4.0–10.5)
nRBC: 0 % (ref 0.0–0.2)

## 2024-02-21 MED ORDER — ONDANSETRON HCL 4 MG/2ML IJ SOLN
4.0000 mg | Freq: Once | INTRAMUSCULAR | Status: AC
  Administered 2024-02-21: 4 mg via INTRAVENOUS
  Filled 2024-02-21: qty 2

## 2024-02-21 MED ORDER — SODIUM CHLORIDE 0.9 % IV BOLUS
500.0000 mL | Freq: Once | INTRAVENOUS | Status: AC
  Administered 2024-02-21: 500 mL via INTRAVENOUS

## 2024-02-21 MED ORDER — KETOROLAC TROMETHAMINE 30 MG/ML IJ SOLN
15.0000 mg | Freq: Once | INTRAMUSCULAR | Status: AC
  Administered 2024-02-22: 15 mg via INTRAVENOUS
  Filled 2024-02-21: qty 1

## 2024-02-21 MED ORDER — MORPHINE SULFATE (PF) 4 MG/ML IV SOLN
4.0000 mg | Freq: Once | INTRAVENOUS | Status: AC
  Administered 2024-02-21: 4 mg via INTRAVENOUS
  Filled 2024-02-21: qty 1

## 2024-02-21 NOTE — ED Provider Notes (Signed)
 [place] EMERGENCY DEPARTMENT AT [facility] Provider Note   CSN: [id number] Arrival date & time: 02/21/24  1913     History  Chief Complaint  Patient presents with   Flank Pain   HPI Allen Basista is a 68 y.o. female with diabetes, seizures, HTN, breast cancer and cerebral meningioma presenting for flank pain.  Started Thursday of last week but much worse in the last couple of days.  It is in the left flank.  Radiates to the left mid abdomen at times.  Is denies urinary symptoms but states that she has had a kidney stone in the past and symptoms are similar.  [place]. She thought it was a hard mattress. The pain is worse with movement.  Denies nausea vomiting diarrhea.  Denies fever.   Flank Pain       Home Medications Prior to Admission medications   Medication Sig Start Date End Date Taking? Authorizing Provider  anastrozole (ARIMIDEX) 1 MG tablet Take 1 mg by mouth daily.    [provider]  aspirin 81 MG tablet Take 81 mg by mouth daily.    [provider]  atorvastatin (LIPITOR) 20 MG tablet Take 20 mg by mouth daily.    [provider]  Cholecalciferol (VITAMIN D PO) Take 1 tablet by mouth daily.    [provider]  cloNIDine (CATAPRES) 0.1 MG tablet Take 0.1 mg by mouth 2 (two) times daily.    [provider]  escitalopram (LEXAPRO) 20 MG tablet Take 20 mg by mouth daily.    [provider]  glimepiride (AMARYL) 4 MG tablet Take 4 mg by mouth 2 (two) times daily.    [provider]  insulin glargine (LANTUS) 100 UNIT/ML injection Inject 70 Units into the skin at bedtime.    [provider]  lamoTRIgine (LAMICTAL) 200 MG tablet Take 200-300 mg by mouth daily. Pt takes 200 mg in the morning and 300 mg in the evening    [provider]  lisinopril (PRINIVIL,ZESTRIL) 10 MG tablet Take 10 mg by mouth daily.    [provider]   metFORMIN (GLUCOPHAGE) 1000 MG tablet Take 1,000 mg by mouth 2 (two) times daily with a meal.    [provider]  mupirocin ointment (BACTROBAN) 2 % Apply to affected toes at bedtime until skin heals 11/15/23   McCaughan, Dia D, DPM  Omega-3 Fatty Acids (FISH OIL) 1000 MG CAPS Take 1,000 mg by mouth daily.    [provider]  oxyCODONE-acetaminophen (PERCOCET/ROXICET) 5-325 MG per tablet Take 1 tablet by mouth every 6 (six) hours as needed for severe pain. Patient not taking: Reported on 11/15/2023 06/07/15   Mackuen, Courteney Lyn, MD  ranitidine (ZANTAC) 300 MG tablet Take 300 mg by mouth at bedtime.    [provider]  tamsulosin (FLOMAX) 0.4 MG CAPS capsule Take 1 capsule (0.4 mg total) by mouth daily. Patient not taking: Reported on 11/15/2023 06/07/15   Mackuen, Cindee Salt, MD  Vit B6-Vit B12-Omega 3 Acids (VITAMIN B PLUS+ PO) Take 1 tablet by mouth daily.    [provider]  VITAMIN E PO Take 2 tablets by mouth daily.    [provider]      Allergies    Paclitaxel, Silver, Cephalexin, Dulaglutide, and Tape    Review of Systems   Review of Systems  Genitourinary:  Positive for flank pain.    Physical Exam  Vitals:   02/21/24 2200 02/21/24 2308  BP: (!) 161/75 (!) 152/76  Pulse: 77 76  Resp: 16 17  Temp:  97.8 F (36.6 C)  SpO2: 97% 96%    CONSTITUTIONAL:  well-appearing, NAD NEURO:  Alert and oriented x 3, CN 3-12 grossly intact EYES:  eyes equal and reactive ENT/NECK:  Supple, no stridor  CARDIO:  regular rate and rhythm, appears well-perfused  PULM:  No respiratory distress, CTAB GI/GU:  non-distended, soft, left cva tenderness MSK/SPINE:  No gross deformities, no edema, moves all extremities  SKIN:  no rash, atraumatic  *Additional and/or pertinent findings included in MDM below   ED Results / Procedures / Treatments   Labs (all labs ordered are listed, but only abnormal results are displayed) Labs Reviewed   URINALYSIS, ROUTINE W REFLEX MICROSCOPIC - Abnormal; Notable for the following components:      Result Value   Specific Gravity, Urine 1.041 (*)    Glucose, UA 50 (*)    Bilirubin Urine SMALL (*)    Protein, ur 100 (*)    All other components within normal limits  CBC - Abnormal; Notable for the following components:   Platelets 103 (*)    All other components within normal limits  COMPREHENSIVE METABOLIC PANEL WITH GFR - Abnormal; Notable for the following components:   Glucose, Bld 208 (*)    Albumin 3.3 (*)    All other components within normal limits    EKG None  Radiology No results found.  Procedures Procedures    Medications Ordered in ED Medications  sodium chloride 0.9 % bolus 500 mL (500 mLs Intravenous Bolus 02/21/24 2239)  morphine (PF) 4 MG/ML injection 4 mg (4 mg Intravenous Given 02/21/24 2236)  ondansetron (ZOFRAN) injection 4 mg (4 mg Intravenous Given 02/21/24 2239)    ED Course/ Medical Decision Making/ A&P                                 Medical Decision Making Amount and/or Complexity of Data Reviewed Labs: ordered. Radiology: ordered.  Risk Prescription drug management.   Initial Impression and Ddx 68 yo well appearing female presenting for flank pain.  Exam notable for left CVA tenderness.  DDx includes kidney stone, pyelonephritis, MSK, rib fracture, pneumothorax, other. Patient PMH that increases complexity of ED encounter:  diabetes, seizures, HTN, breast cancer and cerebral meningioma   Interpretation of Diagnostics - I independent reviewed and interpreted the labs as followed: hyperglycemia (208),   - CT renal pending  Patient Reassessment and Ultimate Disposition/Management Work thus far reassuring.  Leading concern is kidney stone although urinalysis is relatively unremarkable. Could also be a muscle strain as patient does state that it is worse with movement especially rotating, flexing, or extending her back. Plan will be to  follow up on CT scan. If unremarkable would consider conservative treatment for muscle injury. Signed out patient to Barrie Dunker, PA.   Patient management required discussion with the following services or consulting groups:  None  Complexity of Problems Addressed Acute complicated illness or Injury  Additional Data Reviewed and Analyzed Further history obtained from: Past medical history and medications listed in the EMR and Prior ED visit notes  Patient Encounter Risk Assessment Consideration of hospitalization         Final Clinical Impression(s) / ED Diagnoses Final diagnoses:  Left flank pain    Rx / DC Orders ED Discharge Orders  None         Gareth Eagle, PA-C 02/21/24 2333    Benjiman Core, MD 02/22/24 1446

## 2024-02-21 NOTE — ED Triage Notes (Addendum)
 Pt BIB [facility] d/t left sided flank pain that has been intermittent since Thursday of last week, increased in severity Sunday. Has been taking x2 650 mg of tylenol every 3 hours and aspiring for pain with relief for the past two days. Pain worsens with mobility and radiates into groin area. No urinary s/s. No numbness/weakness/tingling of legs.   100 mcg fent IV > with some improvement of pain.  98 RA, PR 72, NSR, 20G L wrist

## 2024-02-22 DIAGNOSIS — R109 Unspecified abdominal pain: Secondary | ICD-10-CM | POA: Diagnosis not present

## 2024-02-22 MED ORDER — LIDOCAINE 5 % EX PTCH: 1.0000 | MEDICATED_PATCH | CUTANEOUS | 0 refills | Status: AC

## 2024-02-22 MED ORDER — NAPROXEN 500 MG PO TABS: 500.0000 mg | ORAL_TABLET | Freq: Two times a day (BID) | ORAL | 0 refills | Status: AC

## 2024-02-22 NOTE — ED Provider Notes (Signed)
  Physical Exam  BP (!) 152/76   Pulse 76   Temp 97.8 F (36.6 C) (Oral)   Resp 17   SpO2 96%   Physical Exam  Procedures  Procedures  ED Course / MDM    Medical Decision Making Amount and/or Complexity of Data Reviewed Labs: ordered. Radiology: ordered.  Risk Prescription drug management.   Patient care assumed at shift handoff from Riki Sheer, PA-C.  Patient with left-sided flank pain which feels similar to previous kidney stones.  Urine grossly unremarkable.  Plan to follow-up on results of CT once available.  CT scan showed no acute finding to explain patient's flank pain.  It does show an incidental finding of some soft tissue mass in the anterior chest.  I did a physical exam of this area and there is a thickened area of skin there.  The patient states that the area has been present for multiple years and that her primary care is aware of it.  Patient states she has a nerve block coming on Tuesday.  When I talked to her again about her pain it seems that is in the left low back and radiates down to the buttock and left thigh.  Pain does sound musculoskeletal in nature.  Patient has prescribed tizanidine at home.  Will add Naprosyn and lidocaine patches and have patient follow-up with her primary care team.  No indication for further emergent workup or admission.  Discharge home.       Natasha Bond 02/22/24 0047    Nira Conn, MD 02/22/24 [phone]

## 2024-02-22 NOTE — Discharge Instructions (Addendum)
 Your condition today is most consistent with back or musculoskeletal pain.  I have prescribed Naprosyn which is to be taken twice daily.  Please monitor for any signs of GI bleeding while taking this medication.  If you see any signs of GI bleeding such as dark stools or bright red blood in your stool discontinue use and seek reevaluation.  I also prescribed lidocaine patches to be placed on the left lower back.  Please follow-up with your primary team for further evaluation as needed.  If you develop any life-threatening symptoms please return to the emergency department.

## 2024-03-28 LAB — COLOGUARD: COLOGUARD: NEGATIVE

## 2024-11-12 ENCOUNTER — Encounter: Payer: Self-pay | Admitting: Podiatry

## 2024-11-12 ENCOUNTER — Ambulatory Visit (INDEPENDENT_AMBULATORY_CARE_PROVIDER_SITE_OTHER): Admitting: Podiatry

## 2024-11-12 ENCOUNTER — Ambulatory Visit (INDEPENDENT_AMBULATORY_CARE_PROVIDER_SITE_OTHER)

## 2024-11-12 DIAGNOSIS — M79674 Pain in right toe(s): Secondary | ICD-10-CM | POA: Diagnosis not present

## 2024-11-12 DIAGNOSIS — M79675 Pain in left toe(s): Secondary | ICD-10-CM | POA: Diagnosis not present

## 2024-11-12 DIAGNOSIS — B351 Tinea unguium: Secondary | ICD-10-CM | POA: Diagnosis not present

## 2024-11-12 DIAGNOSIS — S9031XD Contusion of right foot, subsequent encounter: Secondary | ICD-10-CM

## 2024-11-12 NOTE — Progress Notes (Signed)
 Subjective:   Patient ID: Natasha Bond, female   DOB: 68 y.o.   MRN: [id number]   HPI Patient presents stating she traumatized her right foot and is concerned about fracture occurring 2 weeks ago today and she has nail disease that she cannot take care of 1-5 both feet that become painful   ROS      Objective:  Physical Exam  Neurovascular status intact negative Toula' sign noted right foot lateral side bruised around the fifth metatarsal shaft and base and thick yellow brittle nailbeds 1-5 both feet painful     Assessment:  Contusion possible fracture right lateral foot and mycotic nail infection with pain 1-5 both feet     Plan:  Reviewed both conditions and today went ahead and advised on ice therapy and debrided nailbeds 1-5 both feet no iatrogenic bleeding reappoint routine care
